# Patient Record
Sex: Male | Born: 2005
Health system: Southern US, Community
[De-identification: ages and names within clinical notes are randomized; demographics above are authoritative.]

## PROBLEM LIST (undated history)

## (undated) DIAGNOSIS — J45909 Unspecified asthma, uncomplicated: Secondary | ICD-10-CM

## (undated) DIAGNOSIS — T7840XA Allergy, unspecified, initial encounter: Secondary | ICD-10-CM

## (undated) DIAGNOSIS — J40 Bronchitis, not specified as acute or chronic: Secondary | ICD-10-CM

## (undated) HISTORY — DX: Allergy, unspecified, initial encounter: T78.40XA

## (undated) HISTORY — PX: NO PAST SURGERIES: SHX2092

## (undated) HISTORY — DX: Unspecified asthma, uncomplicated: J45.909

## (undated) HISTORY — DX: Bronchitis, not specified as acute or chronic: J40

---
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
## 2023-07-30 DIAGNOSIS — M9902 Segmental and somatic dysfunction of thoracic region: Secondary | ICD-10-CM | POA: Diagnosis not present

## 2023-07-30 DIAGNOSIS — M9904 Segmental and somatic dysfunction of sacral region: Secondary | ICD-10-CM | POA: Diagnosis not present

## 2023-07-30 DIAGNOSIS — M5386 Other specified dorsopathies, lumbar region: Secondary | ICD-10-CM | POA: Diagnosis not present

## 2023-07-30 DIAGNOSIS — M5414 Radiculopathy, thoracic region: Secondary | ICD-10-CM | POA: Diagnosis not present

## 2023-07-30 DIAGNOSIS — M9903 Segmental and somatic dysfunction of lumbar region: Secondary | ICD-10-CM | POA: Diagnosis not present

## 2023-08-02 ENCOUNTER — Ambulatory Visit: Payer: Commercial Managed Care - PPO | Admitting: Family Medicine

## 2023-08-02 VITALS — BP 128/82 | HR 84 | Ht 68.0 in | Wt 140.0 lb

## 2023-08-02 DIAGNOSIS — M4200 Juvenile osteochondrosis of spine, site unspecified: Secondary | ICD-10-CM | POA: Diagnosis not present

## 2023-08-02 DIAGNOSIS — M545 Low back pain, unspecified: Secondary | ICD-10-CM | POA: Diagnosis not present

## 2023-08-02 DIAGNOSIS — M546 Pain in thoracic spine: Secondary | ICD-10-CM | POA: Diagnosis not present

## 2023-08-02 DIAGNOSIS — G8929 Other chronic pain: Secondary | ICD-10-CM | POA: Diagnosis not present

## 2023-08-02 NOTE — Progress Notes (Unsigned)
   Rubin Payor, PhD, LAT, ATC acting as a scribe for Clementeen Graham, MD.  Maxwell Ramos is a 17 y.o. male who presents to [facility] today for cont'd back pain. Pt was last seen by Dr. Denyse Amass on 01/13/22 and was referred to PT, completing 2 visits.  Today, pt reports back pain has returned and worsened over the last 6 month. Pt locates pain to both sides of his mid to low back. No radiating pain.   Numbness/tingling: yes- in back Aggravates: prolonged standing  Dx imaging: 01/15/22 L-spine & T-spine XR  Pertinent review of systems: No fevers or chills  Relevant historical information: Scheuermann's kyphosis   Exam:  BP 128/82   Pulse 84   Ht 5\' 8"  (1.727 m)   Wt 140 lb (63.5 kg)   SpO2 96%   BMI 21.29 kg/m  General: Well Developed, well nourished, and in no acute distress.   MSK: T-spine nontender to palpation midline.  Nontender palpation lumbar spine midline.  Tender palpation lumbar thoracic junction area paraspinal musculature. Normal lumbar motion.    Lab and Radiology Results  CT scan images of lumbar and the lower portion thoracic spine visible on CT scan abdomen and pelvis from February 25, 2022 personally and independently interpreted.  No acute fractures.  Wedge-shaped appearance of thoracic vertebrae.  Assessment and Plan: 17 y.o. male with chronic back pain located in the junctional region between the thoracic and lumbar spine.  He has what I read is Scheuermann's kyphosis on thoracic spine x-ray.  He had a few physical therapy sessions for the same problem last year.  Today is an acute recurrence of a chronic problem.  Plan to refer back to PT.  Talked about conservative management approaches including heating pad TENS unit percussive massager.  Recommend occasional NSAIDs as needed as well.  Talked about safe dosing of these medications.  Recheck back as needed.   PDMP not reviewed this encounter. Orders Placed This Encounter   Procedures   Ambulatory referral to Physical Therapy    Referral Priority:   Routine    Referral Type:   Physical Medicine    Referral Reason:   Specialty Services Required    Requested Specialty:   Physical Therapy    Number of Visits Requested:   1   No orders of the defined types were placed in this encounter.    Discussed warning signs or symptoms. Please see discharge instructions. Patient expresses understanding.   The above documentation has been reviewed and is accurate and complete Clementeen Graham, M.D.

## 2023-08-02 NOTE — Patient Instructions (Addendum)
Thank you for coming in today.    TENS unit and percussive massager could help.   Swimming is a good idea.   If not better we can do more.   Recheck if not improved especially in 6 weeks.   TENS UNIT: This is helpful for muscle pain and spasm.   Search and Purchase a TENS 7000 2nd edition at  www.tenspros.com or www.Amazon.com It should be less than $30.     TENS unit instructions: Do not shower or bathe with the unit on Turn the unit off before removing electrodes or batteries If the electrodes lose stickiness add a drop of water to the electrodes after they are disconnected from the unit and place on plastic sheet. If you continued to have difficulty, call the TENS unit company to purchase more electrodes. Do not apply lotion on the skin area prior to use. Make sure the skin is clean and dry as this will help prolong the life of the electrodes. After use, always check skin for unusual red areas, rash or other skin difficulties. If there are any skin problems, does not apply electrodes to the same area. Never remove the electrodes from the unit by pulling the wires. Do not use the TENS unit or electrodes other than as directed. Do not change electrode placement without consultating your therapist or physician. Keep 2 fingers with between each electrode. Wear time ratio is 2:1, on to off times.    For example on for 30 minutes off for 15 minutes and then on for 30 minutes off for 15 minutes

## 2023-08-23 NOTE — Therapy (Deleted)
 OUTPATIENT PHYSICAL THERAPY THORACOLUMBAR EVALUATION   Patient Name: Maxwell Ramos MRN: [id number] DOB:[birth year], 17 y.o., male Today's Date: 08/23/2023  END OF SESSION:   Past Medical History:  Diagnosis Date   Allergic state 12/24/2012   Allergy    Asthma    Bronchitis 06/23/2012   Concussion with no loss of consciousness 11/08/2017   Past Surgical History:  Procedure Laterality Date   NO PAST SURGERIES     Patient Active Problem List   Diagnosis Date Noted   Major depressive disorder, single episode, moderate ([facility]) 02/28/2022   Diarrhea 02/28/2022   Gastroenteritis    Salmonella gastroenteritis 02/26/2022   Lactic acidosis, resolved 02/26/2022   AKI (acute kidney injury) ([facility]), resolved 02/26/2022   Scheuermann kyphosis 01/15/2022   Acne vulgaris 08/14/2019   Seasonal allergic rhinitis due to pollen 05/24/2018   Asthma 10/10/2010    PCP: Catherine Charlies LABOR, DO  REFERRING PROVIDER: Joane Artist RAMAN, MD   REFERRING DIAG:  M42.00 (ICD-10-CM) - Scheurmann's disease  M54.6,G89.29 (ICD-10-CM) - Chronic midline thoracic back pain  M54.50,G89.29 (ICD-10-CM) - Chronic bilateral low back pain without sciatica    Rationale for Evaluation and Treatment: Rehabilitation  THERAPY DIAG:  No diagnosis found.  ONSET DATE: last 7 months  SUBJECTIVE:                                                                                                                                                                                           SUBJECTIVE STATEMENT: ***  PERTINENT HISTORY:  ***Scheurmann's disease  PAIN:  Are you having pain? Yes: NPRS scale: *** Pain location: *** Pain description: *** Aggravating factors: *** Relieving factors: ***  PRECAUTIONS: {Therapy precautions:24002}  RED FLAGS: {PT Red Flags:29287}   WEIGHT BEARING RESTRICTIONS: {Yes ***/No:24003}  FALLS:  Has patient fallen in last 6 months? {fallsyesno:27318}  LIVING ENVIRONMENT: Lives  with: {OPRC lives with:25569::lives with their family} Lives in: {Lives in:[address]70} Stairs: {opstairs:27293} Has following equipment at home: {Assistive devices:23999}  OCCUPATION: ***  PLOF: {PLOF:24004}  PATIENT GOALS: ***  NEXT MD VISIT: ***  OBJECTIVE:  Note: Objective measures were completed at Evaluation unless otherwise noted.  DIAGNOSTIC FINDINGS:  ***  PATIENT SURVEYS:  {rehab surveys:24030}  COGNITION: Overall cognitive status: {cognition:24006}     SENSATION: {sensation:27233}  MUSCLE LENGTH: Hamstrings: Right *** deg; Left *** deg Debby test: Right *** deg; Left *** deg  POSTURE: {posture:25561}  PALPATION: ***  LUMBAR ROM:   AROM eval  Flexion   Extension   Right lateral flexion   Left lateral flexion   Right rotation   Left rotation    (Blank rows = not tested)  LE Measurements Lower Extremity Right EVAL Left EVAL   A/PROM MMT A/PROM MMT  Hip Flexion      Hip Extension      Hip Abduction      Hip Adduction      Hip Internal rotation      Hip External rotation      Knee Flexion      Knee Extension      Ankle Dorsiflexion      Ankle Plantarflexion      Ankle Inversion      Ankle Eversion       (Blank rows = not tested) * pain   LUMBAR SPECIAL TESTS:  {lumbar special test:25242}  FUNCTIONAL TESTS:  {Functional tests:24029}  GAIT: Distance walked: *** Assistive device utilized: {Assistive devices:23999} Level of assistance: {Levels of assistance:24026} Comments: ***  TREATMENT DATE: ***                                                                                                                              08/23/2023  Therapeutic Exercise:  Aerobic: Supine: Prone:  Seated:  Standing: Neuromuscular Re-education: Manual Therapy: Therapeutic Activity: Self Care: Trigger Point Dry Needling:  Modalities:     PATIENT EDUCATION:  Education details: on current presentation, on HEP, on clinical outcomes score  and POC Person educated: Patient Education method: Explanation, Demonstration, and Handouts Education comprehension: verbalized understanding   HOME EXERCISE PROGRAM: ***  ASSESSMENT:  CLINICAL IMPRESSION: Patient is a *** y.o. *** who was seen today for physical therapy evaluation and treatment for ***.   OBJECTIVE IMPAIRMENTS: {opptimpairments:25111}.   ACTIVITY LIMITATIONS: {activitylimitations:27494}  PARTICIPATION LIMITATIONS: {participationrestrictions:25113}  PERSONAL FACTORS: {Personal factors:25162} are also affecting patient's functional outcome.   REHAB POTENTIAL: {rehabpotential:25112}  CLINICAL DECISION MAKING: {clinical decision making:25114}  EVALUATION COMPLEXITY: {Evaluation complexity:25115}   GOALS: Goals reviewed with patient? yes  SHORT TERM GOALS: Target date: {follow up:25551} *** MAKE TEXT EDITABLE Patient will be independent in self management strategies to improve quality of life and functional outcomes. Baseline: New Program Goal status: INITIAL  2.  Patient will report at least 50% improvement in overall symptoms and/or function to demonstrate improved functional mobility Baseline: 0% better Goal status: INITIAL  3.  *** Baseline:  Goal status: INITIAL  4.  *** Baseline:  Goal status: INITIAL    LONG TERM GOALS: Target date: {follow up:25551} *** MAKE TEXT EDITABLE  Patient will report at least 75% improvement in overall symptoms and/or function to demonstrate improved functional mobility Baseline: 0% better Goal status: INITIAL  2.  Patient will improve score on FOTO outcomes measure to projected score to demonstrate overall improved function and QOL Baseline: see above Goal status: INITIAL  3.  *** Baseline:  Goal status: INITIAL  4.  *** Baseline:  Goal status: INITIAL   PLAN:  PT FREQUENCY: {rehab frequency:25116}  PT DURATION: {rehab duration:25117}  PLANNED INTERVENTIONS: 97110-Therapeutic exercises, 97530-  Therapeutic activity, [id number]- Neuromuscular re-education, 97535- Self Care, 02859-  Manual therapy, [id number]- Gait training, 02239- Orthotic Fit/training, 04007- Canalith repositioning, 02886- Aquatic Therapy, 97014- Electrical stimulation (unattended), [phone]- Ionotophoresis 4mg /ml Dexamethasone, Patient/Family education, Balance training, Stair training, Taping, Dry Needling, Joint mobilization, Joint manipulation, Spinal manipulation, Spinal mobilization, Cryotherapy, and Moist heat   PLAN FOR NEXT SESSION: ***   11:11 AM, 08/23/23 Olivia Church, DPT Physical Therapy with [facility]

## 2023-08-30 ENCOUNTER — Ambulatory Visit: Payer: Commercial Managed Care - PPO | Admitting: Physical Therapy

## 2023-09-30 NOTE — Therapy (Deleted)
 OUTPATIENT PHYSICAL THERAPY THORACOLUMBAR EVALUATION   Patient Name: Maxwell Ramos MRN: [id number] DOB:Jan 08, 2006, 18 y.o., male Today's Date: 09/30/2023  END OF SESSION:   Past Medical History:  Diagnosis Date   Allergic state 12/24/2012   Allergy    Asthma    Bronchitis 06/23/2012   Concussion with no loss of consciousness 11/08/2017   Past Surgical History:  Procedure Laterality Date   NO PAST SURGERIES     Patient Active Problem List   Diagnosis Date Noted   Major depressive disorder, single episode, moderate (H[facility]) 02/28/2022   Diarrhea 02/28/2022   Gastroenteritis    Salmonella gastroenteritis 02/26/2022   Lactic acidosis, resolved 02/26/2022   AKI (acute kidney injury) ([facility]), resolved 02/26/2022   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 Yes: NPRS scale: *** Pain location: *** Pain description: *** Aggravating factors: *** Relieving factors: ***  PRECAUTIONS: None  RED FLAGS: None   WEIGHT BEARING RESTRICTIONS: No  FALLS:  Has patient fallen in last 6 months? No  LIVING ENVIRONMENT: Lives with: lives  with their family Lives in: {Lives in:[address]} Stairs: {opstairs:27293} Has following equipment at home: {Assistive devices:23999}  OCCUPATION: ***  PLOF: Independent  PATIENT GOALS: ***  NEXT MD VISIT: ***  OBJECTIVE:  Note: Objective measures were completed at Evaluation unless otherwise noted.  DIAGNOSTIC FINDINGS:  ***  PATIENT SURVEYS:  Patient-specific activity functional scoring scheme (Point to one number):  "0" represents "unable to perform." "10" represents "able to perform at prior level. 0 1 2 3 4 5 6 7 8 9  10 (Date and Score) Activity Initial  Activity Eval                      Additional Additional Total score = sum of the activity scores/number of activities Minimum detectable change (90%CI) for average  score = 2 points Minimum detectable change (90%CI) for single activity score = 3 points PSFS developed by: Jake Seats., & Binkley, J. (1995). Assessing disability and change on individual  patients: a report of a patient specific measure. Physiotherapy [place], 47, [phone]. Reproduced with the permission of the authors  Score: ***   COGNITION: Overall cognitive status: {cognition:24006}     SENSATION: {sensation:27233}  MUSCLE LENGTH: Hamstrings: Right *** deg; Left *** deg Maisie Fus test: Right *** deg; Left *** deg  POSTURE: {posture:25561}  PALPATION: ***  LUMBAR ROM:   AROM eval  Flexion   Extension   Right lateral flexion   Left lateral flexion   Right rotation   Left rotation    (Blank rows = not tested)    LE Measurements Lower Extremity Right EVAL Left EVAL   A/PROM MMT A/PROM MMT  Hip Flexion      Hip Extension      Hip Abduction      Hip Adduction      Hip Internal rotation      Hip External rotation      Knee Flexion      Knee Extension      Ankle Dorsiflexion      Ankle Plantarflexion      Ankle Inversion      Ankle Eversion       (Blank rows = not tested) * pain   LUMBAR SPECIAL  TESTS:  {lumbar special test:25242}  FUNCTIONAL TESTS:  {Functional tests:24029}  GAIT: Distance walked: *** Assistive device utilized: {Assistive devices:23999} Level of assistance: {Levels of assistance:24026} Comments: ***  TREATMENT DATE: ***                                                                                                                              09/30/2023  Therapeutic Exercise:  Aerobic: Supine: Prone:  Seated:  Standing: Neuromuscular Re-education: Manual Therapy: Therapeutic Activity: Self Care: Trigger Point Dry Needling:  Modalities:     PATIENT EDUCATION:  Education details: on current presentation, on HEP, on clinical outcomes score and POC Person educated: Patient Education method: Explanation, Demonstration, and Handouts Education comprehension: verbalized understanding   HOME EXERCISE PROGRAM: ***  ASSESSMENT:  CLINICAL IMPRESSION: Patient is a *** y.o. *** who was seen today for physical therapy evaluation and treatment for ***.   OBJECTIVE IMPAIRMENTS: {opptimpairments:25111}.   ACTIVITY LIMITATIONS: {activitylimitations:27494}  PARTICIPATION LIMITATIONS: {participationrestrictions:25113}  PERSONAL FACTORS: {Personal factors:25162} are also affecting patient's functional outcome.   REHAB POTENTIAL: {rehabpotential:25112}  CLINICAL DECISION MAKING: {clinical decision making:25114}  EVALUATION COMPLEXITY: {Evaluation complexity:25115}   GOALS: Goals reviewed with patient? yes  SHORT TERM GOALS: Target date: {follow up:25551} *** MAKE TEXT EDITABLE Patient will be independent in self management strategies to improve quality of life and functional outcomes. Baseline: New Program Goal status: INITIAL  2.  Patient will report at least 50% improvement in overall symptoms and/or function to demonstrate improved functional mobility Baseline:  0% better Goal status: INITIAL  3.  *** Baseline:  Goal status: INITIAL  4.   *** Baseline:  Goal status: INITIAL    LONG TERM GOALS: Target date: {follow up:25551} *** MAKE TEXT EDITABLE  Patient will report at least 75% improvement in overall symptoms and/or function to demonstrate improved functional mobility Baseline: 0% better Goal status: INITIAL  2.  Patient will score at least 2 points higher on PSFS average to demonstrate change in overall function. Baseline: see above Goal status: INITIAL  3.  *** Baseline:  Goal status: INITIAL  4.  *** Baseline:  Goal status: INITIAL   PLAN:  PT FREQUENCY: {rehab frequency:25116}  PT DURATION: {rehab duration:25117}  PLANNED INTERVENTIONS: 97110-Therapeutic exercises, 97530- Therapeutic activity, 97112- Neuromuscular re-education, 97535- Self Care, 16109- Manual therapy, [phone]- Gait training, [phone]- Orthotic Fit/training, [phone]- Canalith repositioning, [id number]- Aquatic Therapy, 97014- Electrical stimulation (unattended), [phone]- Ionotophoresis 4mg /ml Dexamethasone, Patient/Family education, Balance training, Stair training, Taping, Dry Needling, Joint mobilization, Joint manipulation, Spinal manipulation, Spinal mobilization, Cryotherapy, and Moist heat   PLAN FOR NEXT SESSION: ***  12:34 PM, 09/30/23 Tereasa Coop, DPT Physical Therapy with [facility]

## 2023-10-04 ENCOUNTER — Ambulatory Visit: Payer: Commercial Managed Care - PPO | Admitting: Physical Therapy

## 2023-10-11 NOTE — Therapy (Deleted)
 OUTPATIENT PHYSICAL THERAPY THORACOLUMBAR EVALUATION   Patient Name: Maxwell Ramos MRN: [id number] DOB:07/21/2006, 18 y.o., male Today's Date: 10/11/2023  END OF SESSION:   Past Medical History:  Diagnosis Date   Allergic state 12/24/2012   Allergy    Asthma    Bronchitis 06/23/2012   Concussion with no loss of consciousness 11/08/2017   Past Surgical History:  Procedure Laterality Date   NO PAST SURGERIES     Patient Active Problem List   Diagnosis Date Noted   Major depressive disorder, single episode, moderate (HCC) 02/28/2022   Diarrhea 02/28/2022   Gastroenteritis    Salmonella gastroenteritis 02/26/2022   Lactic acidosis, resolved 02/26/2022   AKI (acute kidney injury) (HCC), resolved 02/26/2022   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 Yes: NPRS scale: *** Pain location: *** Pain description: *** Aggravating factors: *** Relieving factors: ***  PRECAUTIONS: None  RED FLAGS: None   WEIGHT BEARING RESTRICTIONS: No  FALLS:  Has patient fallen in last 6 months? No  LIVING ENVIRONMENT: Lives with: lives  with their family Lives in: {Lives in:25570} Stairs: {opstairs:27293} Has following equipment at home: {Assistive devices:23999}  OCCUPATION: ***  PLOF: Independent  PATIENT GOALS: ***  NEXT MD VISIT: ***  OBJECTIVE:  Note: Objective measures were completed at Evaluation unless otherwise noted.  DIAGNOSTIC FINDINGS:  ***  PATIENT SURVEYS:  Patient-specific activity functional scoring scheme (Point to one number):  "0" represents "unable to perform." "10" represents "able to perform at prior level. 0 1 2 3 4 5 6 7 8 9  10 (Date and Score) Activity Initial  Activity Eval                      Additional Additional Total score = sum of the activity scores/number of activities Minimum detectable change (90%CI) for average  score = 2 points Minimum detectable change (90%CI) for single activity score = 3 points PSFS developed by: Jake Seats., & Binkley, J. (1995). Assessing disability and change on individual  patients: a report of a patient specific measure. Physiotherapy [place], [phone], [phone]. Reproduced with the permission of the authors  Score: ***   COGNITION: Overall cognitive status: {cognition:24006}     SENSATION: {sensation:27233}  MUSCLE LENGTH: Hamstrings: Right *** deg; Left *** deg Maisie Fus test: Right *** deg; Left *** deg  POSTURE: {posture:25561}  PALPATION: ***  LUMBAR ROM:   AROM eval  Flexion   Extension   Right lateral flexion   Left lateral flexion   Right rotation   Left rotation    (Blank rows = not tested)    LE Measurements Lower Extremity Right EVAL Left EVAL   A/PROM MMT A/PROM MMT  Hip Flexion      Hip Extension      Hip Abduction      Hip Adduction      Hip Internal rotation      Hip External rotation      Knee Flexion      Knee Extension      Ankle Dorsiflexion      Ankle Plantarflexion      Ankle Inversion      Ankle Eversion       (Blank rows = not tested) * pain   LUMBAR SPECIAL  TESTS:  {lumbar special test:25242}  FUNCTIONAL TESTS:  {Functional tests:24029}  GAIT: Distance walked: *** Assistive device utilized: {Assistive devices:23999} Level of assistance: {Levels of assistance:24026} Comments: ***  TREATMENT DATE: ***                                                                                                                              10/11/2023  Therapeutic Exercise:  Aerobic: Supine: Prone:  Seated:  Standing: Neuromuscular Re-education: Manual Therapy: Therapeutic Activity: Self Care: Trigger Point Dry Needling:  Modalities:     PATIENT EDUCATION:  Education details: on current presentation, on HEP, on clinical outcomes score and POC Person educated: Patient Education method: Explanation, Demonstration, and Handouts Education comprehension: verbalized understanding   HOME EXERCISE PROGRAM: ***  ASSESSMENT:  CLINICAL IMPRESSION: Patient is a *** y.o. *** who was seen today for physical therapy evaluation and treatment for ***.   OBJECTIVE IMPAIRMENTS: {opptimpairments:25111}.   ACTIVITY LIMITATIONS: {activitylimitations:27494}  PARTICIPATION LIMITATIONS: {participationrestrictions:25113}  PERSONAL FACTORS: {Personal factors:25162} are also affecting patient's functional outcome.   REHAB POTENTIAL: {rehabpotential:25112}  CLINICAL DECISION MAKING: {clinical decision making:25114}  EVALUATION COMPLEXITY: {Evaluation complexity:25115}   GOALS: Goals reviewed with patient? yes  SHORT TERM GOALS: Target date: {follow up:25551} *** MAKE TEXT EDITABLE Patient will be independent in self management strategies to improve quality of life and functional outcomes. Baseline: New Program Goal status: INITIAL  2.  Patient will report at least 50% improvement in overall symptoms and/or function to demonstrate improved functional mobility Baseline:  0% better Goal status: INITIAL  3.  *** Baseline:  Goal status: INITIAL  4.   *** Baseline:  Goal status: INITIAL    LONG TERM GOALS: Target date: {follow up:25551} *** MAKE TEXT EDITABLE  Patient will report at least 75% improvement in overall symptoms and/or function to demonstrate improved functional mobility Baseline: 0% better Goal status: INITIAL  2.  Patient will score at least 2 points higher on PSFS average to demonstrate change in overall function. Baseline: see above Goal status: INITIAL  3.  *** Baseline:  Goal status: INITIAL  4.  *** Baseline:  Goal status: INITIAL   PLAN:  PT FREQUENCY: {rehab frequency:25116}  PT DURATION: {rehab duration:25117}  PLANNED INTERVENTIONS: 97110-Therapeutic exercises, 97530- Therapeutic activity, 97112- Neuromuscular re-education, 97535- Self Care, 82956- Manual therapy, [phone]- Gait training, [phone]- Orthotic Fit/training, [phone]- Canalith repositioning, [id number]- Aquatic Therapy, [id number]- Electrical stimulation (unattended), [phone]- Ionotophoresis 4mg /ml Dexamethasone, Patient/Family education, Balance training, Stair training, Taping, Dry Needling, Joint mobilization, Joint manipulation, Spinal manipulation, Spinal mobilization, Cryotherapy, and Moist heat   PLAN FOR NEXT SESSION: ***  12:30 PM, 10/11/23 Tereasa Coop, DPT Physical Therapy with Dolores Lory

## 2023-10-12 ENCOUNTER — Ambulatory Visit: Payer: Commercial Managed Care - PPO | Admitting: Physical Therapy

## 2024-04-20 DIAGNOSIS — M419 Scoliosis, unspecified: Secondary | ICD-10-CM | POA: Diagnosis not present

## 2024-06-08 DIAGNOSIS — M5451 Vertebrogenic low back pain: Secondary | ICD-10-CM | POA: Diagnosis not present

## 2024-06-15 DIAGNOSIS — M5451 Vertebrogenic low back pain: Secondary | ICD-10-CM | POA: Diagnosis not present

## 2024-06-22 DIAGNOSIS — M5451 Vertebrogenic low back pain: Secondary | ICD-10-CM | POA: Diagnosis not present

## 2024-06-29 DIAGNOSIS — M5451 Vertebrogenic low back pain: Secondary | ICD-10-CM | POA: Diagnosis not present

## 2024-07-13 DIAGNOSIS — M5451 Vertebrogenic low back pain: Secondary | ICD-10-CM | POA: Diagnosis not present

## 2024-08-03 DIAGNOSIS — M5451 Vertebrogenic low back pain: Secondary | ICD-10-CM | POA: Diagnosis not present
